# Patient Record
Sex: Male | Born: 1965 | Race: Black or African American | Hispanic: No | Marital: Single | Smoking: Current every day smoker
Health system: Southern US, Community
[De-identification: ages and names within clinical notes are randomized; demographics above are authoritative.]

## PROBLEM LIST (undated history)

## (undated) DIAGNOSIS — E119 Type 2 diabetes mellitus without complications: Secondary | ICD-10-CM

## (undated) DIAGNOSIS — I10 Essential (primary) hypertension: Secondary | ICD-10-CM

## (undated) DIAGNOSIS — M549 Dorsalgia, unspecified: Secondary | ICD-10-CM

## (undated) HISTORY — DX: Type 2 diabetes mellitus without complications: E11.9

---

## 2004-01-11 ENCOUNTER — Emergency Department: Payer: Self-pay | Admitting: Emergency Medicine

## 2005-08-25 ENCOUNTER — Emergency Department: Payer: Self-pay | Admitting: Emergency Medicine

## 2005-09-09 ENCOUNTER — Emergency Department: Payer: Self-pay | Admitting: Emergency Medicine

## 2005-09-13 ENCOUNTER — Emergency Department: Payer: Self-pay | Admitting: Emergency Medicine

## 2005-09-27 ENCOUNTER — Emergency Department: Payer: Self-pay | Admitting: Emergency Medicine

## 2006-07-07 ENCOUNTER — Emergency Department: Payer: Self-pay | Admitting: Unknown Physician Specialty

## 2007-10-30 ENCOUNTER — Emergency Department: Payer: Self-pay | Admitting: Emergency Medicine

## 2007-11-02 ENCOUNTER — Emergency Department: Payer: Self-pay | Admitting: Internal Medicine

## 2007-12-03 ENCOUNTER — Emergency Department: Payer: Self-pay | Admitting: Emergency Medicine

## 2008-01-12 ENCOUNTER — Emergency Department: Payer: Self-pay | Admitting: Emergency Medicine

## 2008-01-21 ENCOUNTER — Emergency Department: Payer: Self-pay | Admitting: Internal Medicine

## 2008-02-06 ENCOUNTER — Emergency Department: Payer: Self-pay | Admitting: Unknown Physician Specialty

## 2010-08-04 ENCOUNTER — Emergency Department: Payer: Self-pay | Admitting: Emergency Medicine

## 2010-08-08 ENCOUNTER — Emergency Department: Payer: Self-pay | Admitting: Internal Medicine

## 2010-08-20 ENCOUNTER — Emergency Department: Payer: Self-pay | Admitting: Internal Medicine

## 2010-09-30 ENCOUNTER — Emergency Department: Payer: Self-pay | Admitting: Emergency Medicine

## 2011-04-06 ENCOUNTER — Emergency Department: Payer: Self-pay

## 2011-06-29 ENCOUNTER — Observation Stay: Payer: Self-pay | Admitting: Internal Medicine

## 2011-06-29 LAB — BASIC METABOLIC PANEL
BUN: 16 mg/dL (ref 7–18)
Chloride: 108 mmol/L — ABNORMAL HIGH (ref 98–107)
EGFR (African American): >60
EGFR (Non-African Amer.): >60
Glucose: 111 mg/dL — ABNORMAL HIGH (ref 65–99)
Potassium: 4.1 mmol/L (ref 3.5–5.1)

## 2011-06-29 LAB — CBC
HGB: 13.8 g/dL (ref 13.0–18.0)
MCH: 29 pg (ref 26.0–34.0)
MCHC: 32.4 g/dL (ref 32.0–36.0)
WBC: 7.7 10*3/uL (ref 3.8–10.6)

## 2011-06-29 LAB — CK TOTAL AND CKMB (NOT AT ARMC)
CK, Total: 290 U/L — ABNORMAL HIGH (ref 35–232)
CK-MB: 2.5 ng/mL (ref 0.5–3.6)
CK-MB: 2.2 ng/mL (ref 0.5–3.6)

## 2011-06-29 LAB — TROPONIN I: Troponin-I: <0.02 ng/mL

## 2011-06-30 LAB — LIPID PANEL
Cholesterol: 137 mg/dL (ref 0–200)
HDL Cholesterol: 25 mg/dL — ABNORMAL LOW (ref 40–60)
Triglycerides: 177 mg/dL (ref 0–200)
VLDL Cholesterol, Calc: 35 mg/dL (ref 5–40)

## 2011-06-30 LAB — BASIC METABOLIC PANEL
BUN: 15 mg/dL (ref 7–18)
Calcium, Total: 8.4 mg/dL — ABNORMAL LOW (ref 8.5–10.1)
Chloride: 107 mmol/L (ref 98–107)
Co2: 26 mmol/L (ref 21–32)
Creatinine: 0.99 mg/dL (ref 0.60–1.30)
Osmolality: 283 (ref 275–301)
Potassium: 3.8 mmol/L (ref 3.5–5.1)
Sodium: 141 mmol/L (ref 136–145)

## 2011-06-30 LAB — CBC WITH DIFFERENTIAL/PLATELET
Basophil #: 0 10*3/uL (ref 0.0–0.1)
Basophil %: 0.5 %
Eosinophil #: 0.2 10*3/uL (ref 0.0–0.7)
HCT: 43.1 % (ref 40.0–52.0)
HGB: 13.9 g/dL (ref 13.0–18.0)
MCH: 29 pg (ref 26.0–34.0)
Monocyte #: 0.6 x10 3/mm (ref 0.2–1.0)
Neutrophil #: 4.4 10*3/uL (ref 1.4–6.5)
Neutrophil %: 51.1 %
RBC: 4.77 10*6/uL (ref 4.40–5.90)
RDW: 14.7 % — ABNORMAL HIGH (ref 11.5–14.5)
WBC: 8.6 10*3/uL (ref 3.8–10.6)

## 2011-06-30 LAB — DRUG SCREEN, URINE
Barbiturates, Ur Screen: NEGATIVE (ref ?–200)
Benzodiazepine, Ur Scrn: NEGATIVE (ref ?–200)
Cocaine Metabolite,Ur ~~LOC~~: NEGATIVE (ref ?–300)
MDMA (Ecstasy)Ur Screen: NEGATIVE (ref ?–500)
Methadone, Ur Screen: NEGATIVE (ref ?–300)
Opiate, Ur Screen: NEGATIVE (ref ?–300)
Phencyclidine (PCP) Ur S: NEGATIVE (ref ?–25)
Tricyclic, Ur Screen: NEGATIVE (ref ?–1000)

## 2011-06-30 LAB — HEMOGLOBIN A1C: Hemoglobin A1C: 7.7 % — ABNORMAL HIGH (ref 4.2–6.3)

## 2012-05-01 ENCOUNTER — Emergency Department: Payer: Self-pay | Admitting: Unknown Physician Specialty

## 2012-05-01 LAB — CBC
MCHC: 33.2 g/dL (ref 32.0–36.0)
RBC: 4.87 10*6/uL (ref 4.40–5.90)
WBC: 9.6 10*3/uL (ref 3.8–10.6)

## 2012-05-01 LAB — URINALYSIS, COMPLETE
Bilirubin,UR: NEGATIVE
Glucose,UR: >=500 mg/dL (ref 0–75)
Nitrite: POSITIVE
Ph: 6 (ref 4.5–8.0)
Protein: NEGATIVE
RBC,UR: 4 /HPF (ref 0–5)
WBC UR: 2 /HPF (ref 0–5)

## 2012-05-01 LAB — BASIC METABOLIC PANEL
Chloride: 110 mmol/L — ABNORMAL HIGH (ref 98–107)
Creatinine: 0.95 mg/dL (ref 0.60–1.30)
EGFR (African American): >60
EGFR (Non-African Amer.): >60

## 2012-05-01 LAB — HEPATIC FUNCTION PANEL A (ARMC)
Alkaline Phosphatase: 99 U/L (ref 50–136)
Bilirubin, Direct: <0.1 mg/dL (ref 0.00–0.20)
Bilirubin,Total: 0.2 mg/dL (ref 0.2–1.0)
SGPT (ALT): 26 U/L (ref 12–78)
Total Protein: 7 g/dL (ref 6.4–8.2)

## 2012-05-01 LAB — TROPONIN I: Troponin-I: <0.02 ng/mL

## 2012-05-01 LAB — CK TOTAL AND CKMB (NOT AT ARMC)
CK, Total: 533 U/L — ABNORMAL HIGH (ref 35–232)
CK-MB: 4.8 ng/mL — ABNORMAL HIGH (ref 0.5–3.6)

## 2012-06-14 ENCOUNTER — Emergency Department: Payer: Self-pay | Admitting: Unknown Physician Specialty

## 2012-06-14 LAB — COMPREHENSIVE METABOLIC PANEL
Albumin: 3.6 g/dL (ref 3.4–5.0)
Anion Gap: 6 — ABNORMAL LOW (ref 7–16)
Calcium, Total: 8.5 mg/dL (ref 8.5–10.1)
Chloride: 105 mmol/L (ref 98–107)
Co2: 27 mmol/L (ref 21–32)
Glucose: 117 mg/dL — ABNORMAL HIGH (ref 65–99)
Osmolality: 279 (ref 275–301)
Potassium: 3.9 mmol/L (ref 3.5–5.1)
Total Protein: 7.5 g/dL (ref 6.4–8.2)

## 2012-06-14 LAB — CBC
HCT: 43.1 % (ref 40.0–52.0)
MCH: 29.4 pg (ref 26.0–34.0)
MCV: 89 fL (ref 80–100)

## 2012-06-14 LAB — TROPONIN I: Troponin-I: <0.02 ng/mL

## 2013-03-15 ENCOUNTER — Emergency Department: Payer: Self-pay | Admitting: Emergency Medicine

## 2013-06-08 ENCOUNTER — Emergency Department: Payer: Self-pay | Admitting: Emergency Medicine

## 2013-06-18 ENCOUNTER — Emergency Department: Payer: Self-pay | Admitting: Emergency Medicine

## 2013-10-07 ENCOUNTER — Emergency Department: Payer: Self-pay | Admitting: Emergency Medicine

## 2013-10-07 LAB — CBC
HCT: 44.8 % (ref 40.0–52.0)
HGB: 14.7 g/dL (ref 13.0–18.0)
MCH: 29.5 pg (ref 26.0–34.0)
MCHC: 32.8 g/dL (ref 32.0–36.0)
MCV: 90 fL (ref 80–100)
Platelet: 216 10*3/uL (ref 150–440)
RBC: 4.99 10*6/uL (ref 4.40–5.90)
RDW: 14.8 % — ABNORMAL HIGH (ref 11.5–14.5)
WBC: 9.2 10*3/uL (ref 3.8–10.6)

## 2013-10-07 LAB — BASIC METABOLIC PANEL
Anion Gap: 8 (ref 7–16)
BUN: 14 mg/dL (ref 7–18)
CHLORIDE: 105 mmol/L (ref 98–107)
CO2: 25 mmol/L (ref 21–32)
CREATININE: 1.06 mg/dL (ref 0.60–1.30)
Calcium, Total: 8.7 mg/dL (ref 8.5–10.1)
EGFR (African American): >60
EGFR (Non-African Amer.): >60
Glucose: 185 mg/dL — ABNORMAL HIGH (ref 65–99)
OSMOLALITY: 281 (ref 275–301)
POTASSIUM: 3.7 mmol/L (ref 3.5–5.1)
Sodium: 138 mmol/L (ref 136–145)

## 2013-10-11 ENCOUNTER — Emergency Department: Payer: Self-pay | Admitting: Emergency Medicine

## 2014-06-29 NOTE — H&P (Signed)
PATIENT NAME:  William Duke, William Duke MR#:  [id number] DATE OF BIRTH:  [birth year]  DATE OF ADMISSION:  06/29/2011  ER REFERRING PHYSICIAN: Maurilio LovelyNoelle McLaurin, MD    PRIMARY CARE PHYSICIAN: None. The patient reports that he occasionally goes to [facility] to get a refill of his medications but does not follow up with a doctor regularly.   CHIEF COMPLAINT: Chest pain.   HISTORY OF PRESENT ILLNESS: The patient is a 49 year old male with past medical history of hypertension, diabetes for the past one year. The patient does not remember what medications he is taking. He has no history of coronary artery disease or heart disease. He has never had any cardiac history or work-up in the past. He denies any drug abuse other than smoking. 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 The patient reports not having similar symptoms in the past, denies any radiation of the pain, denies any associated nausea, vomiting, diaphoresis. Currently he is chest pain free. He says he is normally very active and exercises 3 to 4 times a week, including walking and pushups. He has no family history of premature coronary artery disease.   PAST MEDICAL HISTORY: 1. Hypertension.  2. Diabetes.   PAST SURGICAL HISTORY: None.   MEDICATIONS: The patient does not know what medications he is taking. He says he goes to [facility] on [address].   SOCIAL HISTORY: He smokes 1/2 pack per day for more than 20 years. He denies any alcohol use, occasionally drinks a beer maybe once a month. He denies any drug abuse. He works as a Sales executivesandblaster.   FAMILY HISTORY: Mother had diabetes. Father had diabetes and CVA.    REVIEW OF SYSTEMS: CONSTITUTIONAL: Denies any fever, fatigue, weakness. EYES: Denies any blurred or double vision. ENT: Denies any tinnitus, ear pain. RESPIRATORY: Denies any cough, wheezing. CARDIOVASCULAR: Denies any  chest pain or orthopnea. GI: Denies any nausea, vomiting, diarrhea, or abdominal pain. GU: Denies any dysuria or hematuria. ENDOCRINE: Denies any polyuria or nocturia. HEME/LYMPH: Denies any anemia or easy bruisability. INTEGUMENTARYH: Denies any acne or rash. MUSCULOSKELETAL: Denies any swelling, gout. NEUROLOGICAL: Denies any numbness or weakness. PSYCHIATRIC: Denies any anxiety or depression.   PHYSICAL EXAMINATION:  VITAL SIGNS: Temperature 98.1, heart rate 77, respiratory rate 20, blood pressure 160/74, pulse oximetry 98% on room air.   GENERAL: The patient is a 49 year old African American male sitting comfortably in bed, not in acute distress.   HEENT: Head: Atraumatic, normocephalic. Eyes: There is no pallor, icterus or cyanosis. Pupils equal, round, and reactive to light and accommodation. Extraocular movements intact. ENT: Wet mucous membranes. No oropharyngeal erythema or thrush.   NECK: Supple. No masses. No JVD. No thyromegaly or lymphadenopathy.   CHEST WALL: No tenderness to palpation. Not using accessory muscles of respiration. No intercostal muscle retractions.   LUNGS: Bilaterally clear to auscultation. No wheezing, rales or rhonchi.   CARDIOVASCULAR: S1, S2 regular. No murmur, rubs, or gallops.   ABDOMEN: Soft, nontender, nondistended. No guarding or rigidity. No organomegaly. Normal bowel sounds.   SKIN: No rashes or lesions.   PERIPHERIES: No pedal edema, 2+ pedal pulses.   MUSCULOSKELETAL: No cyanosis or clubbing.   NEUROLOGICAL: Awake, alert, oriented x3. Nonfocal neurological exam. Cranial nerves are grossly intact.   PSYCHIATRIC: Normal mood and affect.   LABORATORY, DIAGNOSTIC AND RADIOLOGICAL DATA:  CK 323, normal troponin.  Chest X-ray shows no acute cardiopulmonary  pathology.  CBC normal. Glucose 111, chloride 108. The rest of the comprehensive metabolic panel is normal.   ASSESSMENT AND PLAN: A 49 year old male with past medical history of diabetes and  hypertension, presents with chest pain.   1. Chest pain: Presentation is atypical; however, given the patient's risk factors of hypertension, diabetes, and smoking we will admit him to the hospital, place him on telemetry, check serial cardiac enzymes, start him on aspirin, beta blocker, ACE inhibitor, statin, and p.r.n. nitroglycerin. We will obtain an inpatient stress test.  2. Diabetes: The patient does not know what medication he is taking. His serum glucose is 111, appears to be well controlled. We will place on an ADA diet, insulin sliding scale, and hemoglobin A1c.  3. Hypertension: Blood pressure is slightly elevated at present. We will start him on a beta blocker and ACE inhibitor.  4. Smoking: The patient has been counseled for more than three minutes. We will place on a nicotine patch.   I reviewed all medical  records, discussed with the patient the plan of care and management, discussed with the ED physician.   TIME SPENT: 75 minutes.  ____________________________ Darrick Meigs, MD sp:cbb D: 06/29/2011 16:04:56 ET T: 06/29/2011 16:36:51 ET JOB#: [id number]  cc: Darrick Meigs, MD, <Dictator> Darrick Meigs MD ELECTRONICALLY SIGNED 06/30/2011 13:17

## 2014-06-29 NOTE — Discharge Summary (Signed)
PATIENT NAME:  William Duke, William Duke MR#:  [id number] DATE OF BIRTH:  1965/11/17  DATE OF ADMISSION:  06/29/2011 DATE OF DISCHARGE:  06/30/2011  PRIMARY CARE PHYSICIAN: [facility]   REASON FOR ADMISSION: Chest pain.   DISCHARGE DIAGNOSES: 1. Chest pain, atypical suspected to be of noncardiac etiology, possibly related to gastroesophageal reflux disease with negative cardiac enzymes and negative stress test.  2. History of hypertension.  3. History of type 2 diabetes mellitus which is failing diet control, hemoglobin A1c 7.7. 4. History of tobacco abuse.   CONSULTATION: None.   DISCHARGE DISPOSITION: Home.   DISCHARGE MEDICATIONS:  1. Aspirin 81 mg daily.  2. Metoprolol 25 mg p.o. b.i.d.  3. Lisinopril 5 mg p.o. daily.  4. Metformin 500 mg p.o. b.i.d.  5. Prilosec 20 mg p.o. daily.  6. Tylenol 325 mg 1 to 2 tablets p.o. every 4 to 6 hours p.r.n. pain.   DISCHARGE CONDITION: Improved, stable.  DISCHARGE ACTIVITY: As tolerated.   DISCHARGE DIET: Low sodium, ADA, low fat, low cholesterol.   DISCHARGE INSTRUCTIONS:  1. Take medications as prescribed.  2. Return to the Emergency Department for recurrence of symptoms or for development of shortness of breath, chest pain, arm, shoulder or jaw pain.    FOLLOWUP INSTRUCTIONS: Follow up with your primary care physician at [facility] 1 to 2 weeks.   LABORATORY, DIAGNOSTIC AND RADIOLOGICAL DATA:  Chest x-ray PA and lateral 06/29/2011: No acute cardiopulmonary abnormalities are noted.   LexiScan/Myoview 06/30/2011: Normal study with normal LV systolic function. No evidence of ischemia. No significant EKG changes. Ejection fraction 58%.   Hemoglobin A1c 7.7.   Lipid panel: Total cholesterol 137, triglycerides 177, HDL 25, LDL 77.   Troponins were negative x3 sets.   Urine drug screen negative.   CBC normal on admission.   EKG on admission: Normal sinus rhythm, heart rate 72 beats per minute with normal axis,  normal intervals, nonspecific T wave abnormality.   BRIEF HISTORY/HOSPITAL COURSE: Patient is a 49 year old male with past medical history of hypertension, type 2 diabetes mellitus and tobacco abuse who presented to the Emergency Department with complaints of chest pain. Please see dictated admission history and physical for pertinent details surrounding the onset of this hospitalization and please see below for further details. 1. Chest pain which is atypical with nonspecific T wave abnormality on admission with negative troponin. Thereafter patient was admitted to the hospital for further evaluation and management. He was maintained on aspirin therapy as well as p.r.n. pain control. Patient's chest pain has spontaneously resolved. He has ruled out for myocardial infarction based off three negative sets of cardiac enzymes. Thereafter he was sent for Myoview which was a negative study. He does have some risk factors for coronary artery disease including hypertension, diabetes mellitus and tobacco abuse for which he has been started on aspirin for primary prevention. Chest pain could be gastroesophageal reflux disease related for which he will be empirically started on a trial of PPI therapy and will need close outpatient follow up. He is chest pain free at the time of discharge at rest and also with ambulation. In regards to his hypertension, blood pressure is well controlled and he is to continue beta blocker and ACE inhibitor therapy. In regards to his type 2 diabetes mellitus, this is failing diet control and hemoglobin A1c was 7.7 and he is being started on metformin for diabetic control at the time of discharge and will need close outpatient follow-up.  2. In  regards to his tobacco abuse, he was strongly counseled on the importance of smoking cessation.  3. On 06/30/2011 patient was hemodynamically stable and without any chest pain and was felt to be stable for discharge home with close outpatient follow  up.  TIME SPENT ON DISCHARGE: Greater than 30 minutes.   ____________________________ Elon AlasKamran N. Storie Heffern, MD knl:cms D: 07/04/2011 13:18:39 ET T: 07/05/2011 09:46:05 ET JOB#: [id number]  cc: Elon AlasKamran N. Rayelynn Loyal, MD, <Dictator> [facility]  Elon AlasKAMRAN N Jariya Reichow MD ELECTRONICALLY SIGNED 07/11/2011 15:09

## 2015-04-13 IMAGING — CR DG THORACIC SPINE 2-3V
1 series · 4 of 4 positions shown · non-contrast
Comparison: None.

CLINICAL DATA: Pain post trauma

EXAM:
THORACIC SPINE -3 VIEW

[Series 1: t thoracic spine ap · 0.14mm/px · 4 of 4 slices shown]
[im 1/4]
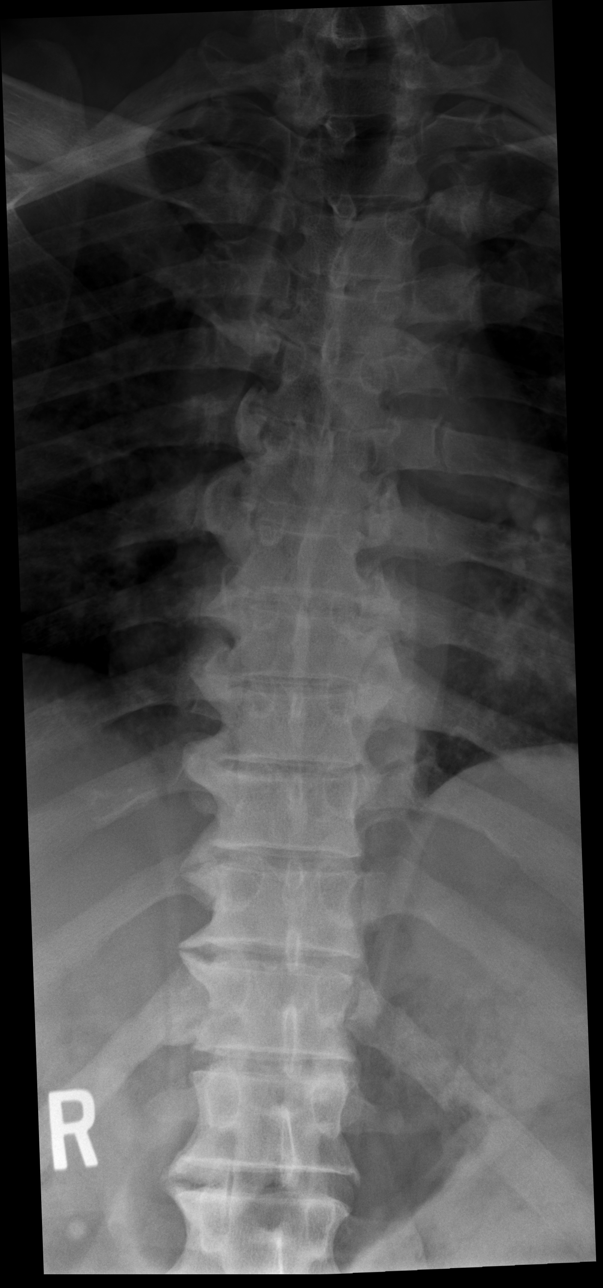
[im 2/4]
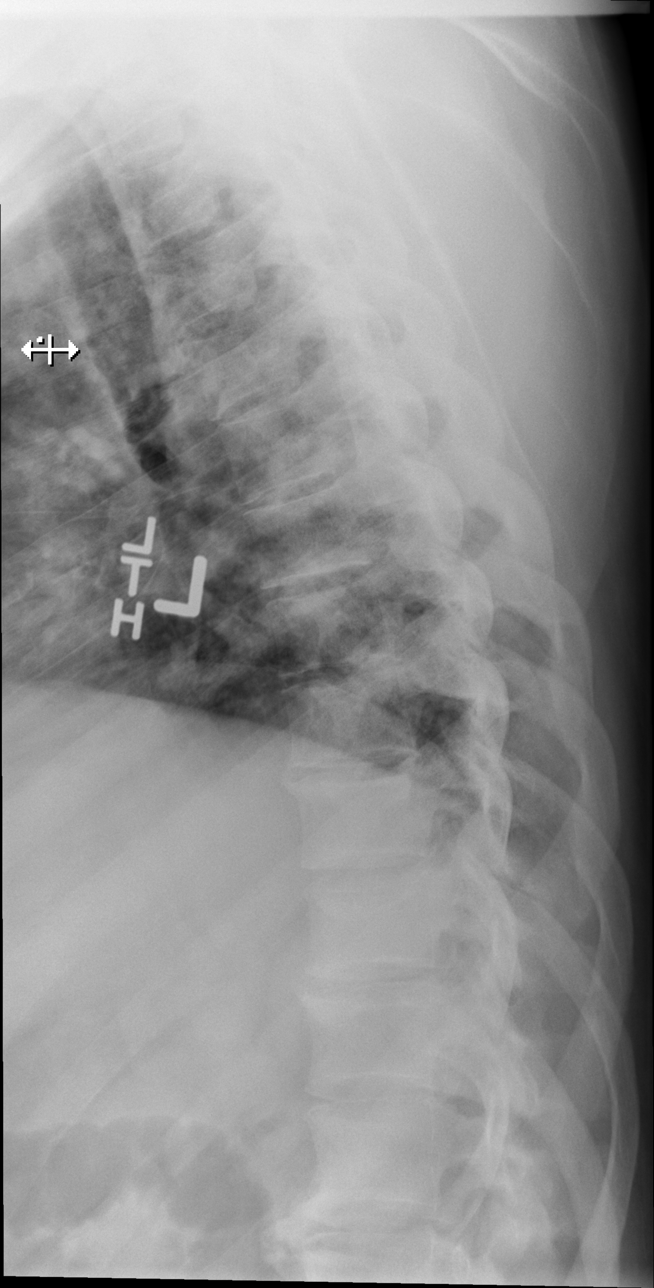
[im 3/4]
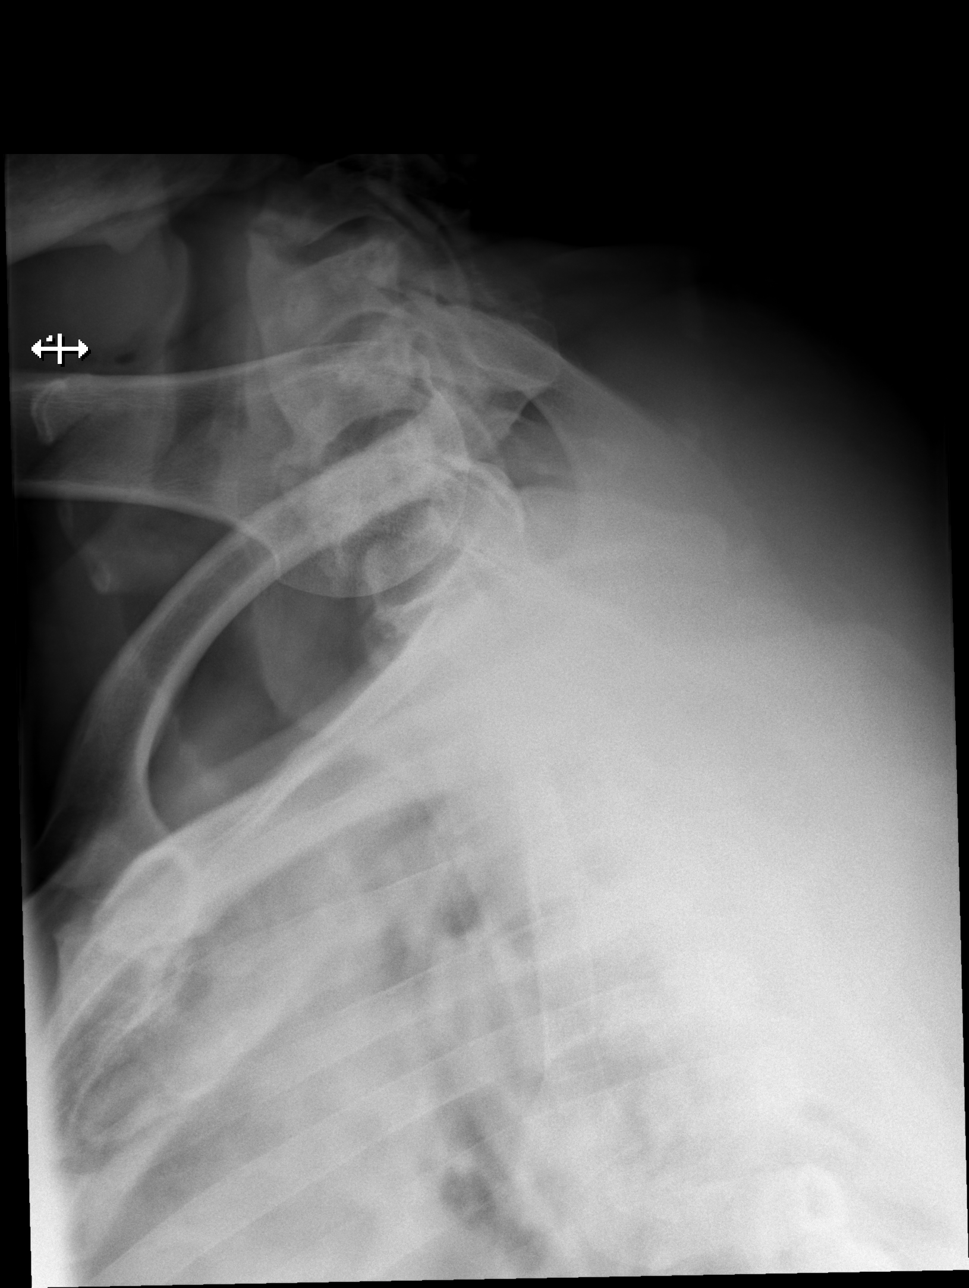
[im 4/4]
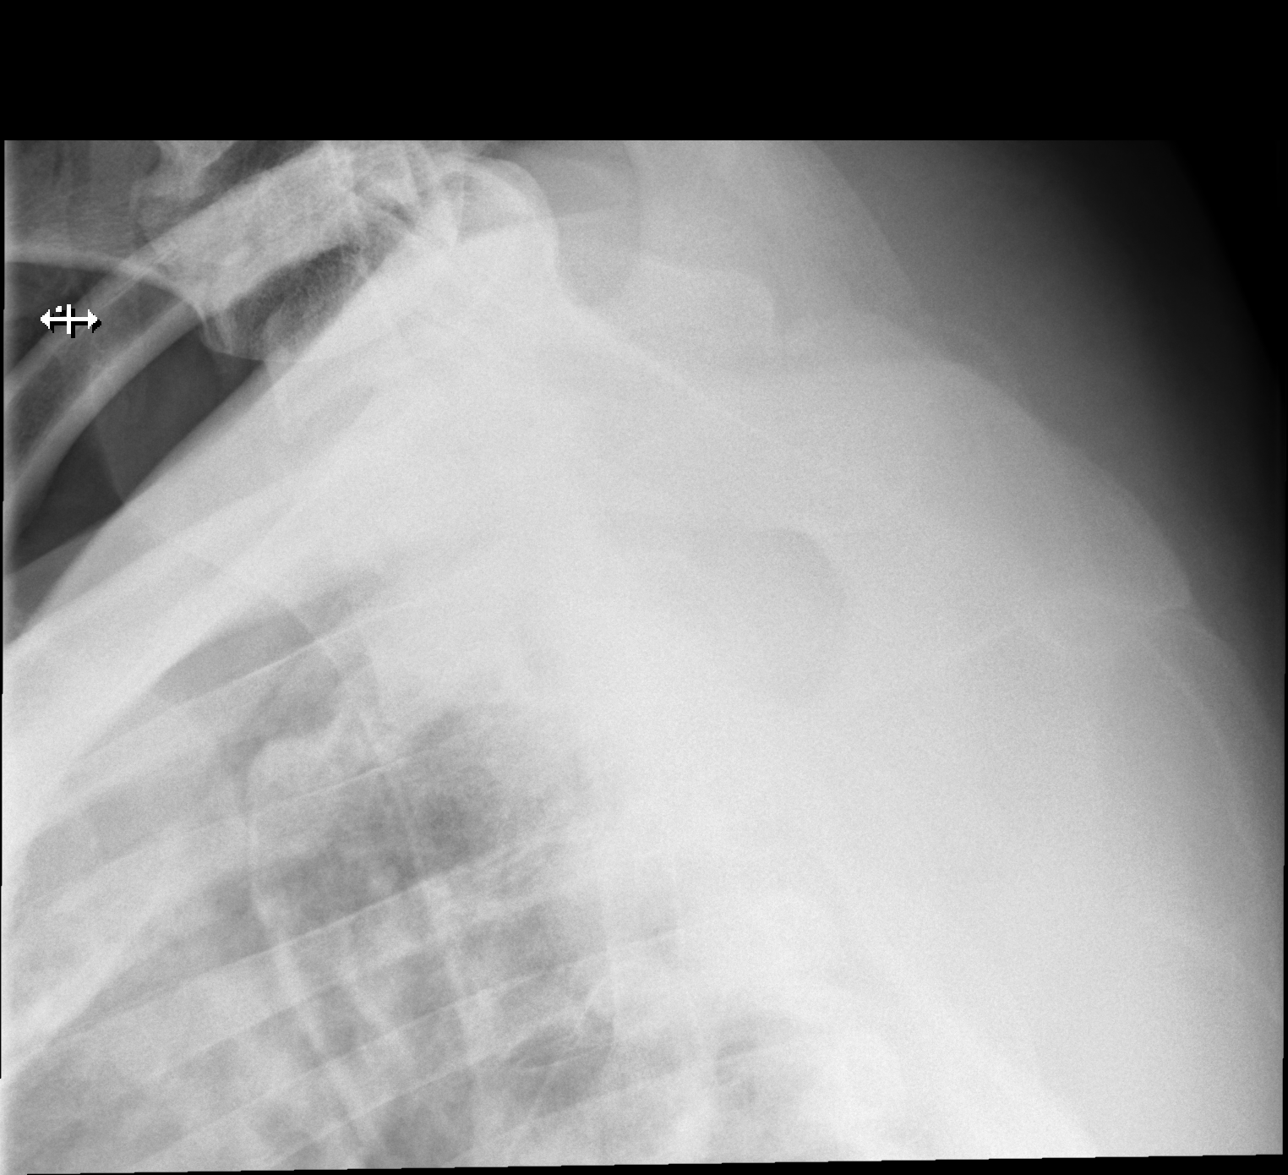

[4 of 4 positions shown; findings below may reference images not displayed]

FINDINGS: Frontal, lateral, and swimmer's views were obtained. There is mild
upper thoracic levoscoliosis. There is no demonstrable fracture or
spondylolisthesis. There is mild disc space narrowing at several
levels as well as several right-sided osteophytes consistent with
osteoarthritic change. No erosive change.
IMPRESSION: Osteoarthritic changes several levels. Mild scoliosis. No fracture
or spondylolisthesis.

## 2015-04-13 IMAGING — CR PELVIS - 1-2 VIEW
1 series · 1 of 1 positions shown · non-contrast
Comparison: 10/07/2013

CLINICAL DATA: Injury, fall, pain

EXAM:
PELVIS - 1-2 VIEW

[t pelvis ap]
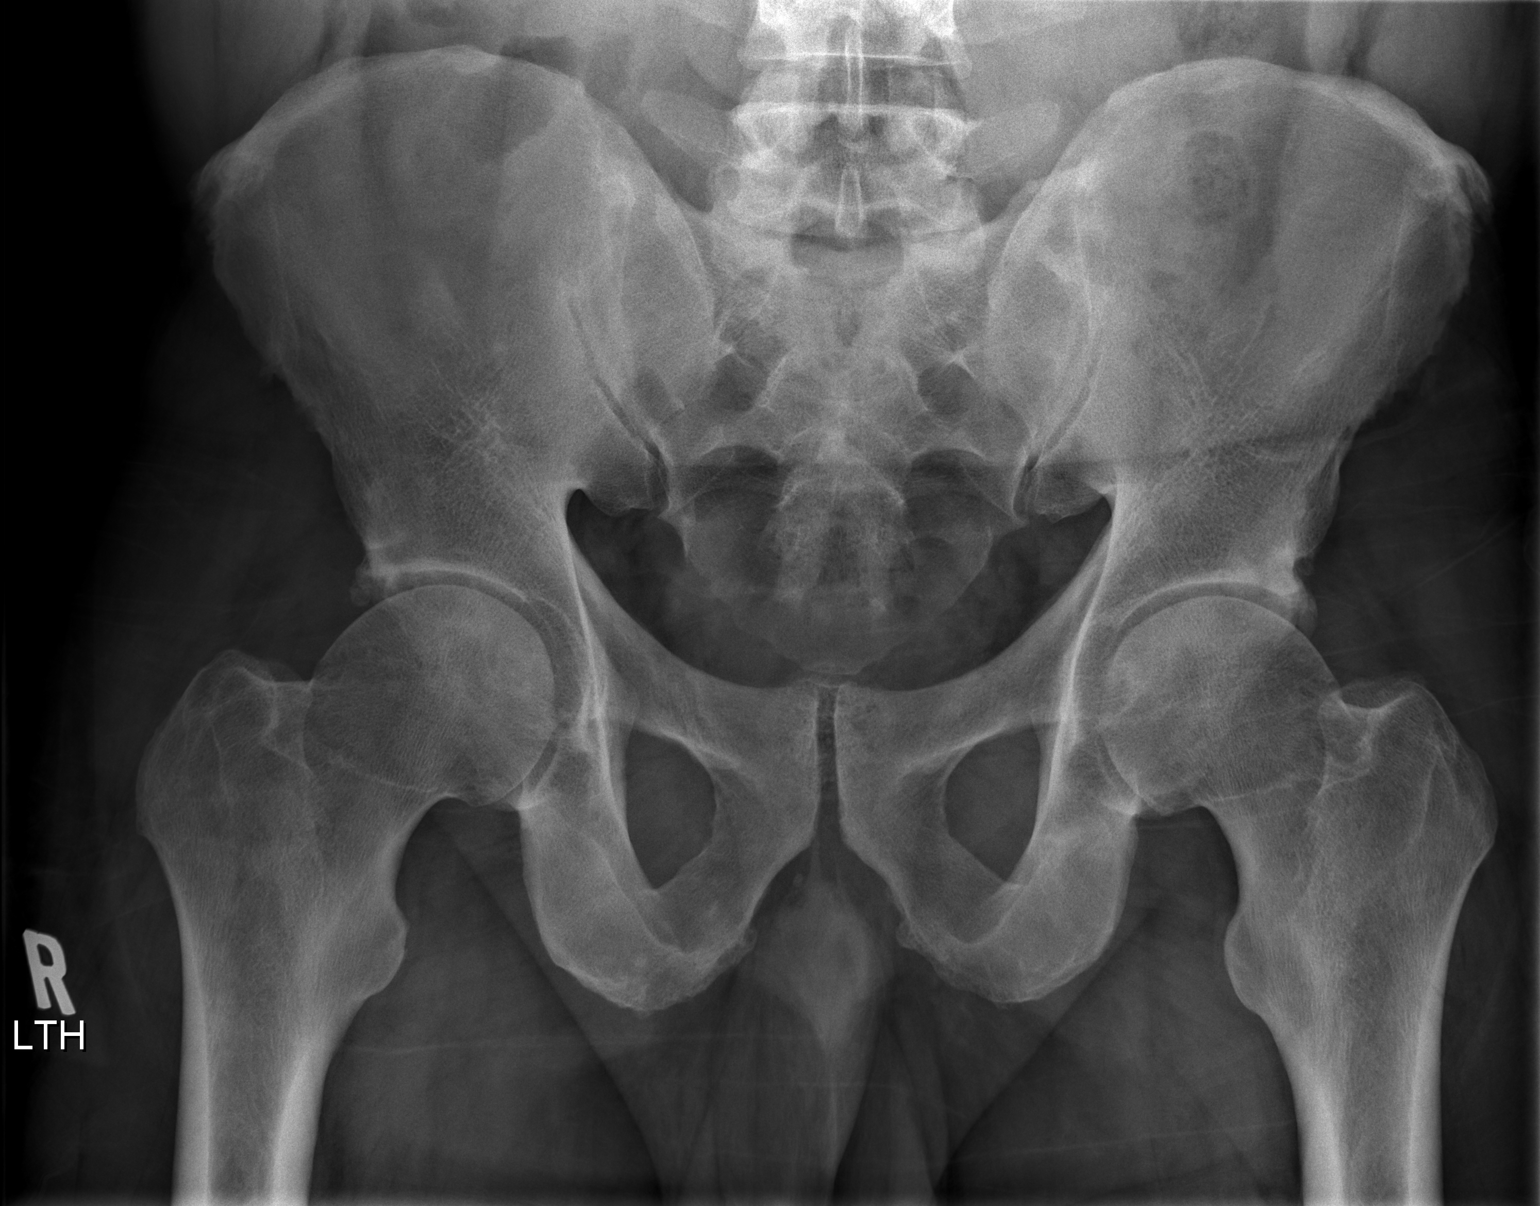

[1 of 1 positions shown; findings below may reference images not displayed]

FINDINGS: Mild hip degenerative changes. Normal alignment. No displaced
fracture. Pelvis intact. No diastases. Normal appearing SI joints.
IMPRESSION: No acute osseous finding

## 2015-04-13 IMAGING — CR DG LUMBAR SPINE 2-3V
1 series · 3 of 3 positions shown · non-contrast
Comparison: None.

CLINICAL DATA: Pain post trauma

EXAM:
LUMBAR SPINE - 2-3 VIEW

[Series 1: t lumbar spine ap · 0.14mm/px · 3 of 3 slices shown]
[im 1/3]
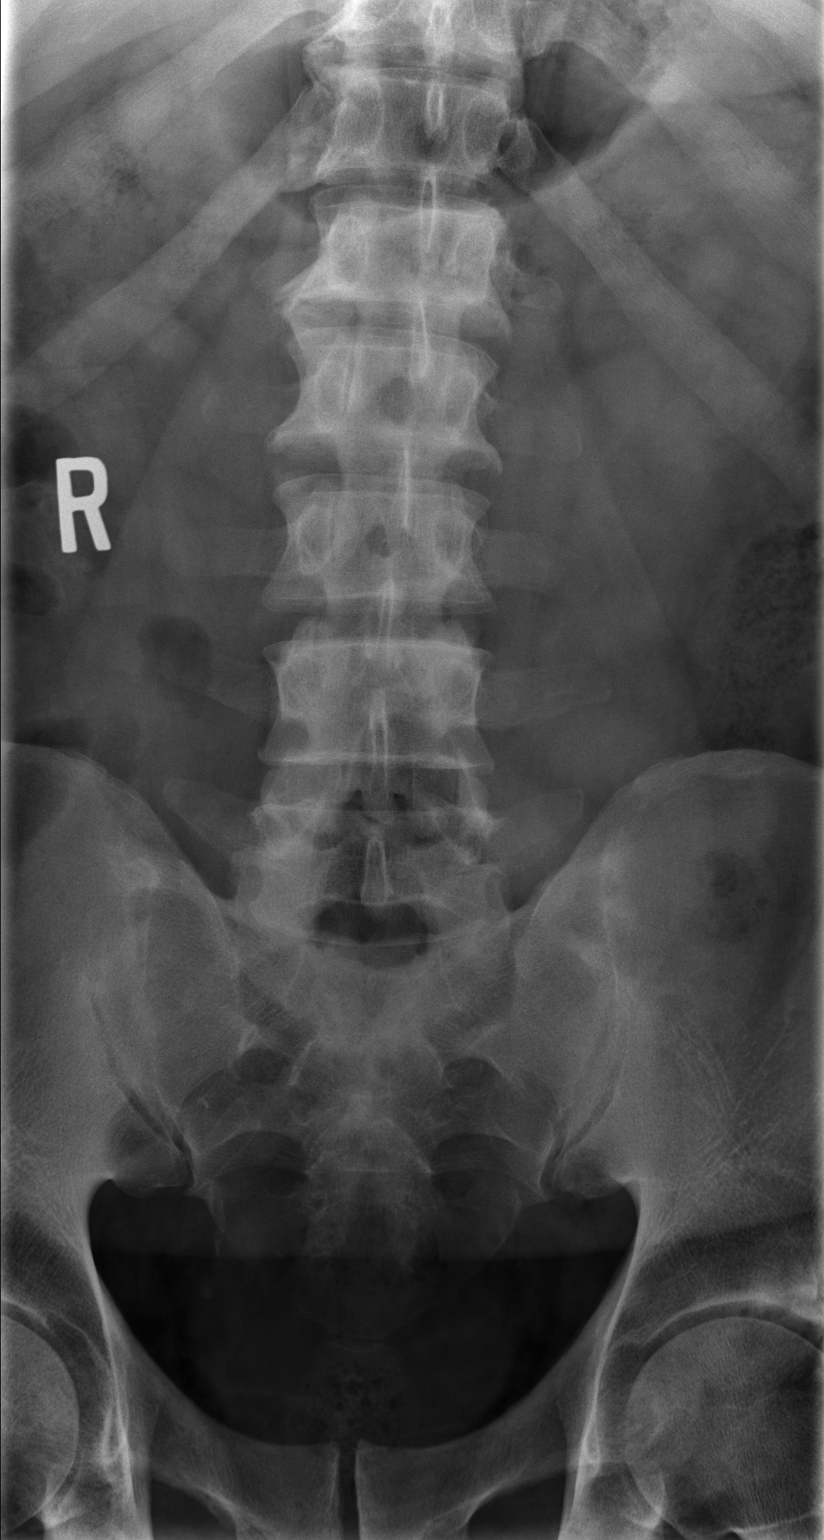
[im 2/3]
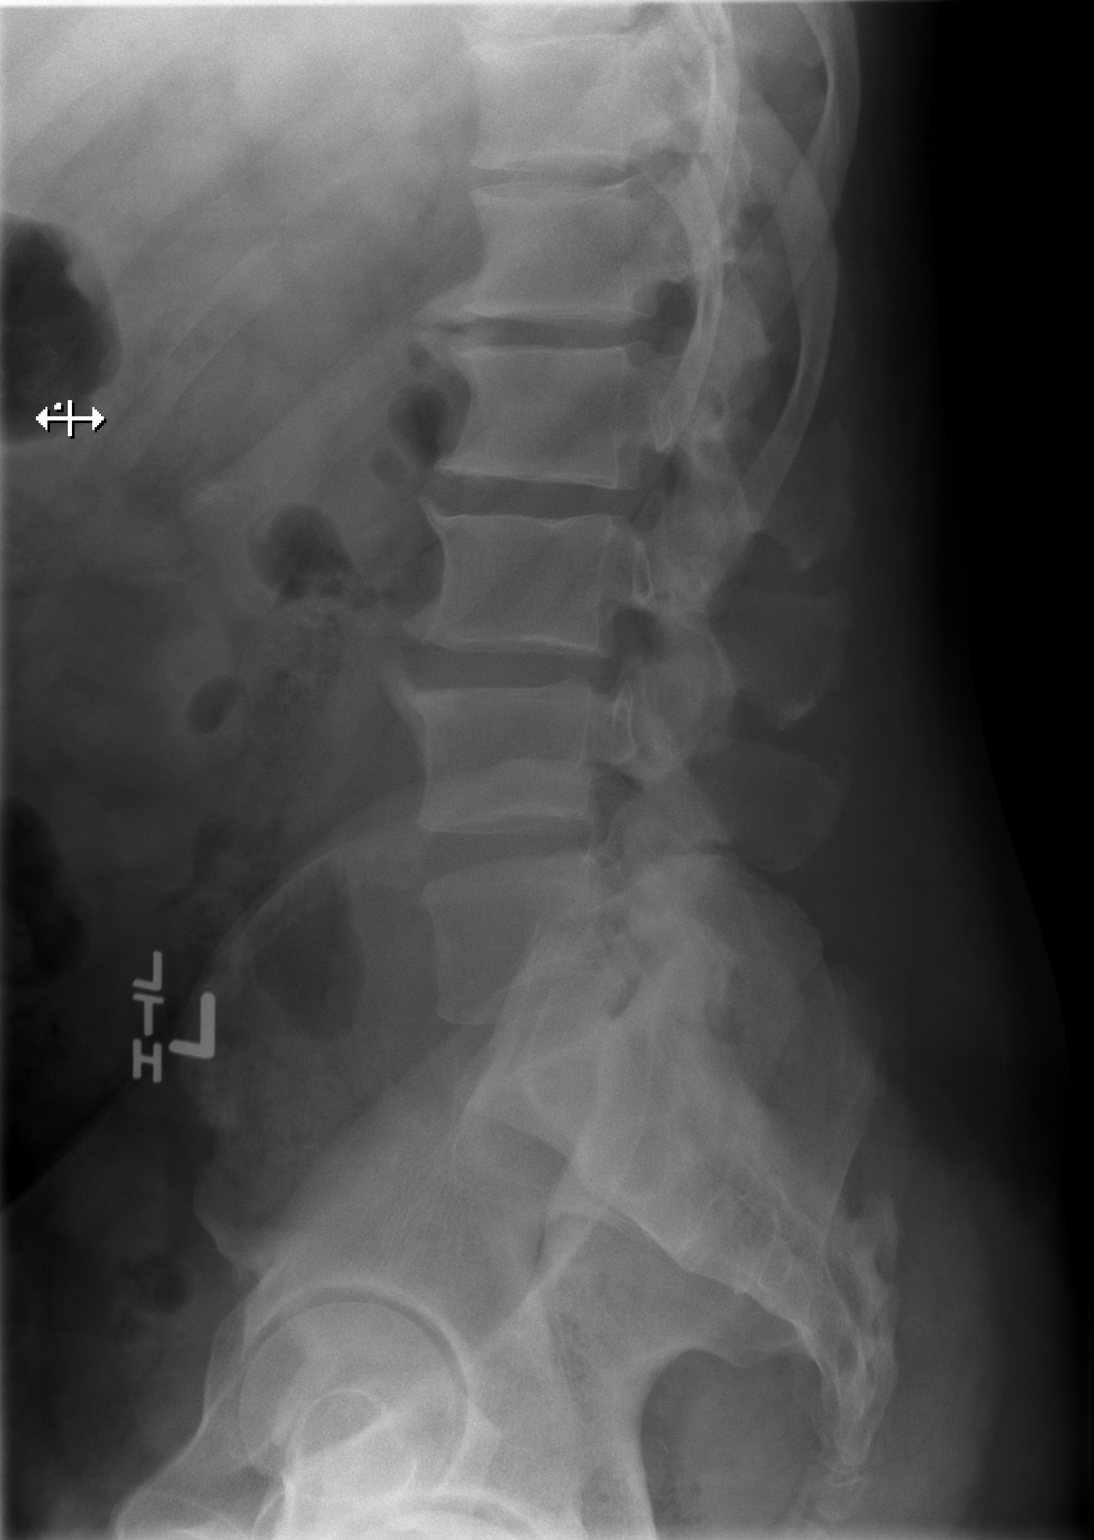
[im 3/3]
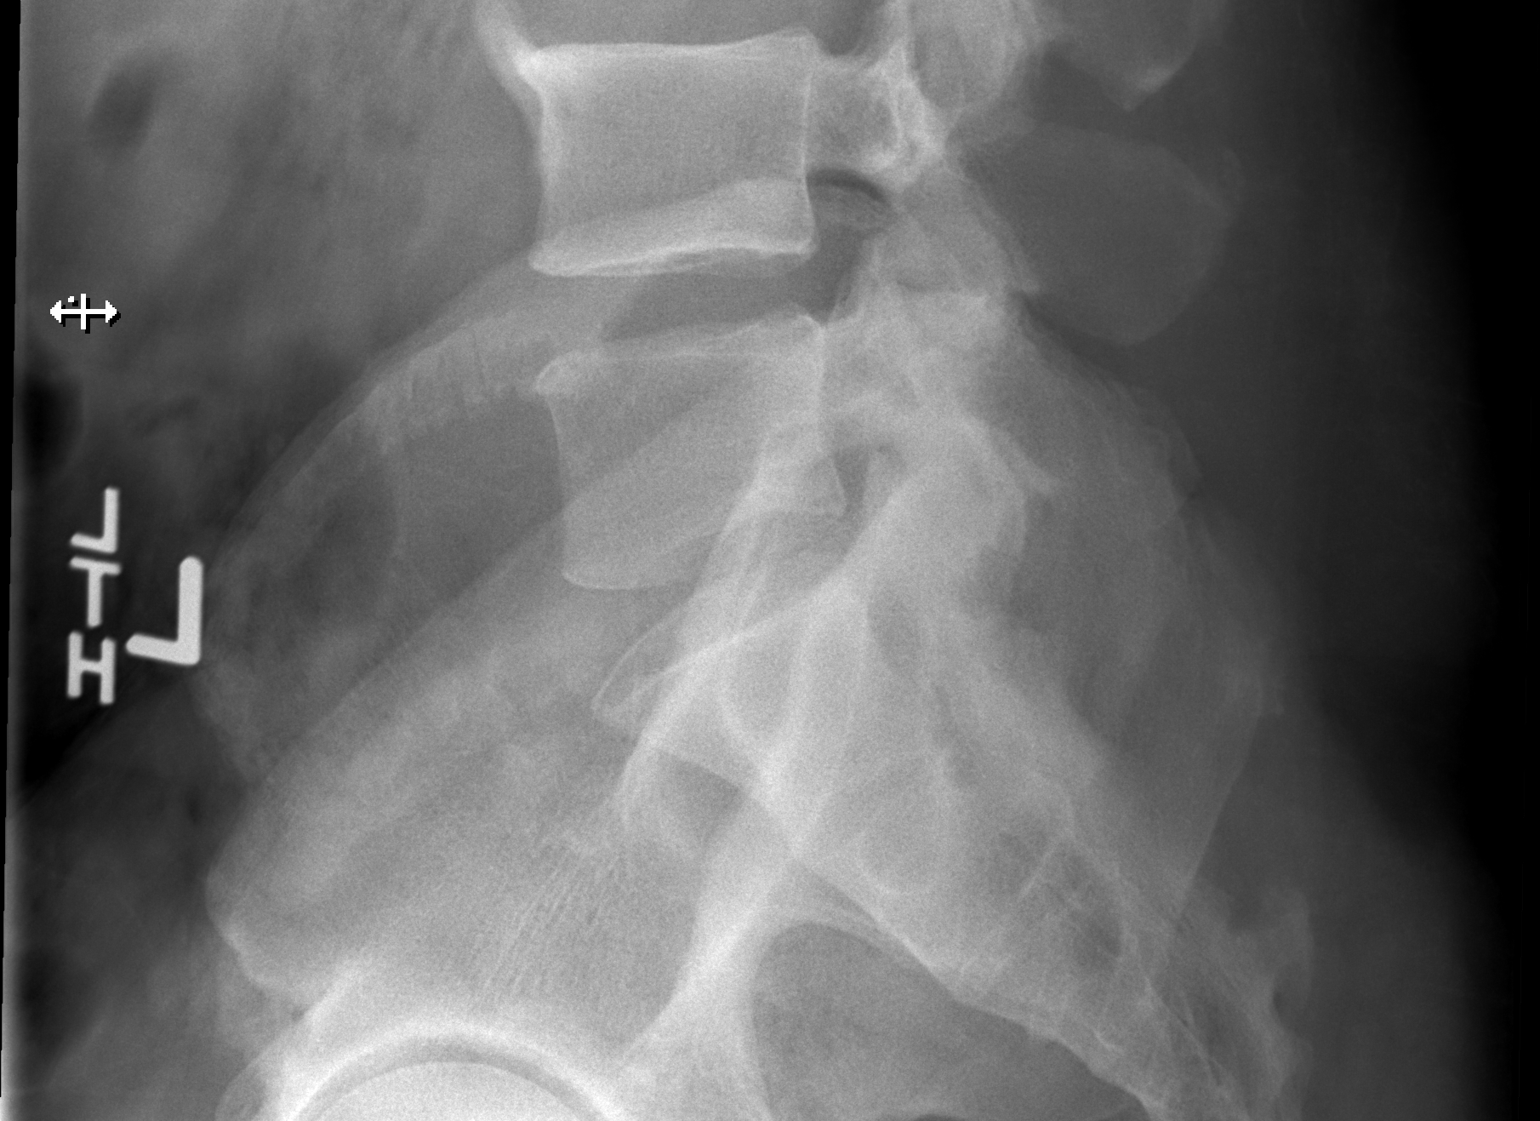

[3 of 3 positions shown; findings below may reference images not displayed]

FINDINGS: Frontal, lateral, and spot lumbosacral lateral images were obtained.
There are 5 non-rib-bearing lumbar type vertebral bodies. There is
no fracture or spondylolisthesis. There is mild disc space narrowing
at L1-2. There are anterior osteophytes at several levels. No
erosive change.
IMPRESSION: Areas of osteoarthritic change.  No fracture or spondylolisthesis.

## 2017-02-16 ENCOUNTER — Encounter: Payer: Self-pay | Admitting: Emergency Medicine

## 2017-02-16 ENCOUNTER — Emergency Department
Admission: EM | Admit: 2017-02-16 | Discharge: 2017-02-16 | Disposition: A | Discharge status: Home / Self Care | Payer: Self-pay | Attending: Emergency Medicine | Admitting: Emergency Medicine

## 2017-02-16 ENCOUNTER — Other Ambulatory Visit: Payer: Self-pay

## 2017-02-16 DIAGNOSIS — Y9389 Activity, other specified: Secondary | ICD-10-CM | POA: Insufficient documentation

## 2017-02-16 DIAGNOSIS — F17200 Nicotine dependence, unspecified, uncomplicated: Secondary | ICD-10-CM | POA: Insufficient documentation

## 2017-02-16 DIAGNOSIS — Y998 Other external cause status: Secondary | ICD-10-CM | POA: Insufficient documentation

## 2017-02-16 DIAGNOSIS — S39012A Strain of muscle, fascia and tendon of lower back, initial encounter: Secondary | ICD-10-CM | POA: Insufficient documentation

## 2017-02-16 DIAGNOSIS — Y929 Unspecified place or not applicable: Secondary | ICD-10-CM | POA: Insufficient documentation

## 2017-02-16 DIAGNOSIS — X500XXA Overexertion from strenuous movement or load, initial encounter: Secondary | ICD-10-CM | POA: Insufficient documentation

## 2017-02-16 DIAGNOSIS — I10 Essential (primary) hypertension: Secondary | ICD-10-CM | POA: Insufficient documentation

## 2017-02-16 HISTORY — DX: Dorsalgia, unspecified: M54.9

## 2017-02-16 HISTORY — DX: Essential (primary) hypertension: I10

## 2017-02-16 MED ORDER — MELOXICAM 15 MG PO TABS: 15.0000 mg | ORAL_TABLET | Freq: Every day | ORAL | 2 refills | Status: AC

## 2017-02-16 MED ORDER — CYCLOBENZAPRINE HCL 10 MG PO TABS: 10.0000 mg | ORAL_TABLET | Freq: Three times a day (TID) | ORAL | 0 refills | Status: DC | PRN

## 2017-02-16 MED ORDER — KETOROLAC TROMETHAMINE 30 MG/ML IJ SOLN
30.0000 mg | Freq: Once | INTRAMUSCULAR | Status: AC
  Administered 2017-02-16: 30 mg via INTRAMUSCULAR
  Filled 2017-02-16: qty 1

## 2017-02-16 MED ORDER — CYCLOBENZAPRINE HCL 10 MG PO TABS
5.0000 mg | ORAL_TABLET | Freq: Once | ORAL | Status: AC
  Administered 2017-02-16: 5 mg via ORAL
  Filled 2017-02-16: qty 1

## 2017-02-16 NOTE — ED Triage Notes (Signed)
Brought via ems with lower back pain states pain is moving into both legs   States pain started after shoveling snow

## 2017-02-16 NOTE — ED Provider Notes (Signed)
[facility] Emergency Department Provider Note  ____________________________________________   First MD Initiated Contact with Patient 02/16/17 1653     (approximate)  I have reviewed the triage vital signs and the nursing notes.   HISTORY  Chief Complaint Back Pain    HPI William Duke is a 51 y.o. male complains of low back pain, states he shoveled snow 2 days ago states pain radiates to both legs, he is having difficulty moving, he arrived by EMS today, he denies any fever, chills, chest pain shortness of breath or difficulty with bowels or bladder  Past Medical History:  Diagnosis Date  . Back pain   . Hypertension     There are no active problems to display for this patient.   History reviewed. No pertinent surgical history.  Prior to Admission medications   Medication Sig Start Date End Date Taking? Authorizing Provider  cyclobenzaprine (FLEXERIL) 10 MG tablet Take 1 tablet (10 mg total) by mouth 3 (three) times daily as needed for muscle spasms. 02/16/17   Eyleen Rawlinson, Roselyn BeringSusan W, PA-C  meloxicam (MOBIC) 15 MG tablet Take 1 tablet (15 mg total) by mouth daily. 02/16/17 02/16/18  Faythe GheeFisher, Daveda Larock W, PA-C    Allergies Patient has no known allergies.  No family history on file.  Social History Social History   Tobacco Use  . Smoking status: Current Every Day Smoker  . Smokeless tobacco: Never Used  Substance Use Topics  . Alcohol use: No    Frequency: Never  . Drug use: No    Review of Systems Constitutional: No fever/chills Eyes: No visual changes. ENT: No sore throat. Respiratory: Denies cough Genitourinary: Negative for dysuria. Musculoskeletal: Positive for back pain. Skin: Negative for rash.    ____________________________________________   PHYSICAL EXAM:  VITAL SIGNS: ED Triage Vitals  Enc Vitals Group     BP 02/16/17 1617 (!) 152/83     Pulse Rate 02/16/17 1617 98     Resp 02/16/17 1617 16     Temp 02/16/17  1617 98.7 F (37.1 C)     Temp Source 02/16/17 1617 Oral     SpO2 02/16/17 1617 96 %     Weight 02/16/17 1617 260 lb (117.9 kg)     Height 02/16/17 1617 6\' 3"  (1.905 m)     Head Circumference --      Peak Flow --      Pain Score 02/16/17 1633 9     Pain Loc --      Pain Edu? --      Excl. in GC? --     Constitutional: Alert and oriented. Well appearing and in no acute distress. Eyes: Conjunctivae are normal.  Head: Atraumatic. Nose: No congestion/rhinnorhea. Mouth/Throat: Mucous membranes are moist.   Cardiovascular: Normal rate, regular rhythm.  Heart sounds are normal Respiratory: Normal respiratory effort.  No retractions, lungs are clear to auscultation GU: deferred Musculoskeletal: FROM all extremities, warm and well perfused.  Patient moves slowly, the lumbar area is tender to palpation, positive for spasms in the lumbar area, negative straight leg raise, patient is able to stand on his own, he is able to lift both great toes, neurovascular is intact Neurologic:  Normal speech and language.  Skin:  Skin is warm, dry and intact. No rash noted. Psychiatric: Mood and affect are normal. Speech and behavior are normal.  ____________________________________________   LABS (all labs ordered are listed, but only abnormal results are displayed)  Labs Reviewed - No data to display  ____________________________________________   ____________________________________________  RADIOLOGY    ____________________________________________   PROCEDURES  Procedure(s) performed: No      ____________________________________________   INITIAL IMPRESSION / ASSESSMENT AND PLAN / ED COURSE  Pertinent labs & imaging results that were available during my care of the patient were reviewed by me and considered in my medical decision making (see chart for details).  Patient is a 51 year old male that complains of low back pain after shoveling snow, his pain was relieved with Toradol  30 mg IM and Flexeril 10 mg p.o., he is being discharged with a prescription for meloxicam 15 mg daily and Flexeril 10 mg 3 times a day for muscle strain and spasm, he is to apply ice to the area, he is to perform stretches, if he is worsening he should return to the emergency department or see his regular doctor, if he is not better in 5-7 days he is to follow-up with orthopedics, orthopedic doctors phone number was given to him, he agrees with the treatment plan and states he will comply, patient was discharged in stable condition      ____________________________________________   FINAL CLINICAL IMPRESSION(S) / ED DIAGNOSES  Final diagnoses:  Strain of lumbar region, initial encounter      NEW MEDICATIONS STARTED DURING THIS VISIT:  This SmartLink is deprecated. Use AVSMEDLIST instead to display the medication list for a patient.   Note:  This document was prepared using Dragon voice recognition software and may include unintentional dictation errors.    Faythe GheeFisher, Leinaala Catanese W, PA-C 02/16/17 1757    Schaevitz, Myra Rudeavid Matthew, MD 02/16/17 [phone]

## 2017-02-16 NOTE — Discharge Instructions (Signed)
Follow-up with your regular doctor if you are not better in 5-7 days, apply ice to your lower back, try to stretch her muscles as much as you can, you have been given meloxicam 15 mg daily for pain and inflammation, Flexeril 10 mg 3 times a day as needed for muscle strain and pain, the Flexeril may make you drowsy so do not operate heavy machinery while using this medication, if you are worsening please return to the emergency department

## 2017-03-15 ENCOUNTER — Emergency Department
Admission: EM | Admit: 2017-03-15 | Discharge: 2017-03-15 | Disposition: A | Discharge status: Home / Self Care | Payer: Self-pay | Attending: Emergency Medicine | Admitting: Emergency Medicine

## 2017-03-15 ENCOUNTER — Encounter: Payer: Self-pay | Admitting: *Deleted

## 2017-03-15 ENCOUNTER — Other Ambulatory Visit: Payer: Self-pay

## 2017-03-15 DIAGNOSIS — J019 Acute sinusitis, unspecified: Secondary | ICD-10-CM | POA: Insufficient documentation

## 2017-03-15 DIAGNOSIS — Z79899 Other long term (current) drug therapy: Secondary | ICD-10-CM | POA: Insufficient documentation

## 2017-03-15 DIAGNOSIS — F17210 Nicotine dependence, cigarettes, uncomplicated: Secondary | ICD-10-CM | POA: Insufficient documentation

## 2017-03-15 DIAGNOSIS — J209 Acute bronchitis, unspecified: Secondary | ICD-10-CM | POA: Insufficient documentation

## 2017-03-15 DIAGNOSIS — J40 Bronchitis, not specified as acute or chronic: Secondary | ICD-10-CM

## 2017-03-15 MED ORDER — AZITHROMYCIN 500 MG PO TABS: 500.0000 mg | ORAL_TABLET | Freq: Every day | ORAL | 0 refills | Status: AC

## 2017-03-15 MED ORDER — BENZONATATE 100 MG PO CAPS: 100.0000 mg | ORAL_CAPSULE | Freq: Three times a day (TID) | ORAL | 0 refills | Status: AC | PRN

## 2017-03-15 NOTE — ED Triage Notes (Signed)
Pt reports sinus congestion, runny nose, cough.  Sx for 2 days.  Pt alert

## 2017-03-15 NOTE — ED Provider Notes (Signed)
[facility] Emergency Department Provider Note    First MD Initiated Contact with Patient 03/15/17 0407     (approximate)  I have reviewed the triage vital signs and the nursing notes.   HISTORY  Chief Complaint Sinus Problem    HPI William Duke is a 52 y.o. male to the emergency department with a 2-day history of nasal congestion rhinorrhea cough.  Patient does admit to half a pack per day cigarette use.  Patient denies any fever afebrile on presentation.  Patient asleep on my arrival to the treatment room.   Past Medical History:  Diagnosis Date  . Back pain   . Hypertension     There are no active problems to display for this patient.   No past surgical history on file.  Prior to Admission medications   Medication Sig Start Date End Date Taking? Authorizing Provider  cyclobenzaprine (FLEXERIL) 10 MG tablet Take 1 tablet (10 mg total) by mouth 3 (three) times daily as needed for muscle spasms. 02/16/17   Fisher, Roselyn BeringSusan W, PA-C  meloxicam (MOBIC) 15 MG tablet Take 1 tablet (15 mg total) by mouth daily. 02/16/17 02/16/18  Faythe GheeFisher, Susan W, PA-C    Allergies Patient has no known allergies.  No family history on file.  Social History Social History   Tobacco Use  . Smoking status: Current Every Day Smoker  . Smokeless tobacco: Never Used  Substance Use Topics  . Alcohol use: No    Frequency: Never  . Drug use: No    Review of Systems Constitutional: No fever/chills Eyes: No visual changes. ENT: No sore throat.  Positive for nasal congestion Cardiovascular: Denies chest pain. Respiratory: Denies shortness of breath.  Positive for cough  gastrointestinal: No abdominal pain.  No nausea, no vomiting.  No diarrhea.  No constipation. Genitourinary: Negative for dysuria. Musculoskeletal: Negative for neck pain.  Negative for back pain. Integumentary: Negative for rash. Neurological: Negative for headaches, focal weakness or  numbness.   ____________________________________________   PHYSICAL EXAM:  VITAL SIGNS: ED Triage Vitals  Enc Vitals Group     BP 03/15/17 0203 (!) 189/92     Pulse Rate 03/15/17 0203 66     Resp 03/15/17 0203 18     Temp 03/15/17 0203 98.6 F (37 C)     Temp Source 03/15/17 0203 Oral     SpO2 03/15/17 0203 95 %     Weight 03/15/17 0204 115.7 kg (255 lb)     Height 03/15/17 0204 1.905 m (6\' 3" )     Head Circumference --      Peak Flow --      Pain Score --      Pain Loc --      Pain Edu? --      Excl. in GC? --     Constitutional: Alert and oriented. Well appearing and in no acute distress. Eyes: Conjunctivae are normal.  Head: Atraumatic. Mouth/Throat: Mucous membranes are moist. Oropharynx non-erythematous. Neck: No stridor.   Cardiovascular: Normal rate, regular rhythm. Good peripheral circulation. Grossly normal heart sounds. Respiratory: Normal respiratory effort.  No retractions. Lungs CTAB. Gastrointestinal: Soft and nontender. No distention.  Musculoskeletal: No lower extremity tenderness nor edema. No gross deformities of extremities. Neurologic:  Normal speech and language. No gross focal neurologic deficits are appreciated.  Skin:  Skin is warm, dry and intact. No rash noted. Psychiatric: Mood and affect are normal. Speech and behavior are normal.    Procedures   ____________________________________________  INITIAL IMPRESSION / ASSESSMENT AND PLAN / ED COURSE  As part of my medical decision making, I reviewed the following data within the electronic MEDICAL RECORD [id number] year old male present with above-stated history and physical exam of nasal congestion rhinorrhea and cough.  Concern for possible sinusitis and bronchitis.  Consider the possibility of pneumonia however patient afebrile adventitious sounds noted on auscultation.  Patient prescribed azithromycin.  Patient also given Tessalon Perles FINAL CLINICAL IMPRESSION(S) / ED DIAGNOSES  Final  diagnoses:  Bronchitis  Acute non-recurrent sinusitis, unspecified location     MEDICATIONS GIVEN DURING THIS VISIT:  Medications - No data to display   ED Discharge Orders    None       Note:  This document was prepared using Dragon voice recognition software and may include unintentional dictation errors.    Darci Current, MD 03/15/17 [phone]

## 2018-07-25 ENCOUNTER — Encounter: Payer: Self-pay | Admitting: Emergency Medicine

## 2018-07-25 ENCOUNTER — Emergency Department
Admission: EM | Admit: 2018-07-25 | Discharge: 2018-07-25 | Disposition: A | Discharge status: Home / Self Care | Payer: Self-pay | Attending: Emergency Medicine | Admitting: Emergency Medicine

## 2018-07-25 ENCOUNTER — Other Ambulatory Visit: Payer: Self-pay

## 2018-07-25 ENCOUNTER — Emergency Department: Payer: Self-pay

## 2018-07-25 DIAGNOSIS — F17200 Nicotine dependence, unspecified, uncomplicated: Secondary | ICD-10-CM | POA: Insufficient documentation

## 2018-07-25 DIAGNOSIS — S39012A Strain of muscle, fascia and tendon of lower back, initial encounter: Secondary | ICD-10-CM | POA: Insufficient documentation

## 2018-07-25 DIAGNOSIS — Y9389 Activity, other specified: Secondary | ICD-10-CM | POA: Insufficient documentation

## 2018-07-25 DIAGNOSIS — X509XXA Other and unspecified overexertion or strenuous movements or postures, initial encounter: Secondary | ICD-10-CM | POA: Insufficient documentation

## 2018-07-25 DIAGNOSIS — I10 Essential (primary) hypertension: Secondary | ICD-10-CM | POA: Insufficient documentation

## 2018-07-25 DIAGNOSIS — Y990 Civilian activity done for income or pay: Secondary | ICD-10-CM | POA: Insufficient documentation

## 2018-07-25 DIAGNOSIS — Y9289 Other specified places as the place of occurrence of the external cause: Secondary | ICD-10-CM | POA: Insufficient documentation

## 2018-07-25 MED ORDER — PREDNISONE 20 MG PO TABS: 20.0000 mg | ORAL_TABLET | Freq: Two times a day (BID) | ORAL | 0 refills | Status: AC

## 2018-07-25 MED ORDER — CYCLOBENZAPRINE HCL 10 MG PO TABS: 10.0000 mg | ORAL_TABLET | Freq: Three times a day (TID) | ORAL | 0 refills | Status: AC | PRN

## 2018-07-25 NOTE — ED Provider Notes (Signed)
[facility] Emergency Department Provider Note ____________________________________________  Time seen: 1015  I have reviewed the triage vital signs and the nursing notes.  HISTORY  Chief Complaint  Back Pain  HPI William Duke is a 53 y.o. male presents to the ED with a 7581-month complaint of intermittent low back pain.  Patient just denies any recent injury, accident, or trauma.  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  He describes lifting large logs and scraps of wood, he denies seeking care at the onset or anytime in the interim.  He presents now for evaluation of his back pain.  He denies any fevers, chills, sweats he also denies any chest pain, shortness of breath.  He has had no distal paresthesias, foot drop, bladder or bowel incontinence, or saddle anesthesia.  Past Medical History:  Diagnosis Date  . Back pain   . Hypertension     There are no active problems to display for this patient.   History reviewed. No pertinent surgical history.  Prior to Admission medications   Medication Sig Start Date End Date Taking? Authorizing Provider  cyclobenzaprine (FLEXERIL) 10 MG tablet Take 1 tablet (10 mg total) by mouth 3 (three) times daily as needed for up to 10 days. 07/25/18 08/04/18  Dhruv Christina, Charlesetta IvoryJenise V Bacon, PA-C  predniSONE (DELTASONE) 20 MG tablet Take 1 tablet (20 mg total) by mouth 2 (two) times daily with a meal for 5 days. 07/25/18 07/30/18  Sabriah Hobbins, Charlesetta IvoryJenise V Bacon, PA-C    Allergies Patient has no known allergies.  No family history on file.  Social History Social History   Tobacco Use  . Smoking status: Current Every Day Smoker  . Smokeless tobacco: Never Used  Substance Use Topics  . Alcohol use: No    Frequency: Never  . Drug use: No    Review of Systems  Constitutional: Negative for fever. Eyes: Negative for visual changes. ENT: Negative for sore throat. Cardiovascular:  Negative for chest pain. Respiratory: Negative for shortness of breath. Gastrointestinal: Negative for abdominal pain, vomiting and diarrhea. Genitourinary: Negative for dysuria. Musculoskeletal: Positive for back pain. Skin: Negative for rash. Neurological: Negative for headaches, focal weakness or numbness. ____________________________________________  PHYSICAL EXAM:  VITAL SIGNS: ED Triage Vitals  Enc Vitals Group     BP 07/25/18 0945 (!) 182/106     Pulse Rate 07/25/18 0945 75     Resp 07/25/18 0945 16     Temp 07/25/18 0945 97.7 F (36.5 C)     Temp Source 07/25/18 0945 Oral     SpO2 07/25/18 0945 92 %     Weight 07/25/18 0946 240 lb (108.9 kg)     Height 07/25/18 0946 6\' 2"  (1.88 m)     Head Circumference --      Peak Flow --      Pain Score 07/25/18 0950 8     Pain Loc --      Pain Edu? --      Excl. in GC? --     Constitutional: Alert and oriented. Well appearing and in no distress. Head: Normocephalic and atraumatic. Eyes: Conjunctivae are normal. Normal extraocular movements Cardiovascular: Normal rate, regular rhythm. Normal distal pulses. Respiratory: Normal respiratory effort. No wheezes/rales/rhonchi. Gastrointestinal: Soft and nontender. No distention. Musculoskeletal: Normal spinal alignment without midline tenderness, spasm, deformity, or step-off.  Patient transitions from sit to stand without assistance.  Normal flexion extension range on exam.  Nontender with  normal range of motion in all extremities.  Neurologic: Nerves II through XII grossly intact.  Normal LE DTRs bilaterally.  Negative seated straight leg raise.  Normal gait without ataxia. Normal speech and language. No gross focal neurologic deficits are appreciated. Skin:  Skin is warm, dry and intact. No rash noted. Psychiatric: Mood and affect are normal. Patient exhibits appropriate insight and judgment. ____________________________________________   RADIOLOGY  Lumbar Spine No acute  findings. Mild degenerative changes.  ____________________________________________  PROCEDURES  Procedures ____________________________________________  INITIAL IMPRESSION / ASSESSMENT AND PLAN / ED COURSE  William Duke was evaluated in Emergency Department on 07/25/2018 for the symptoms described in the history of present illness. He was evaluated in the context of the global COVID-19 pandemic, which necessitated consideration that the patient might be at risk for infection with the SARS-CoV-2 virus that causes COVID-19. Institutional protocols and algorithms that pertain to the evaluation of patients at risk for COVID-19 are in a state of rapid change based on information released by regulatory bodies including the CDC and federal and state organizations. These policies and algorithms were followed during the patient's care in the ED.  Patient with ED evaluation of a 2 to 78-month complaint of low back pain.  Patient is with a benign exam on presentation.  His x-ray is negative for any acute findings at this time.  Patient is discharged with prescriptions for cyclobenzaprine and prednisone.  The patient left with his discharge instructions provided by me, but a secure was not secured on the signature pad. ____________________________________________  FINAL CLINICAL IMPRESSION(S) / ED DIAGNOSES  Final diagnoses:  Strain of lumbar region, initial encounter      Lissa Hoard, PA-C 07/25/18 1737    Jeanmarie Plant, MD 07/27/18 0000

## 2018-07-25 NOTE — ED Notes (Signed)
Pt ambulatory to treatment room. Pt c/o back pain over the last few months. States nothing makes it better or worse, states pain is constant

## 2018-07-25 NOTE — Discharge Instructions (Signed)
Take the prescription meds as directed. Follow-up with [facility] as needed.  

## 2018-07-25 NOTE — ED Triage Notes (Signed)
Pt here with c/o back pain from carrying "heavy stuff" at work a few months ago, wants to claim workers comp because his back pain is getting worse. 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

## 2018-07-25 NOTE — ED Notes (Signed)
Patient left prior to giving signature for discharge.  Patient received discharge instructions and discharge paperwork.

## 2020-07-23 ENCOUNTER — Emergency Department
Admission: EM | Admit: 2020-07-23 | Discharge: 2020-07-23 | Disposition: A | Discharge status: Home / Self Care | Payer: Self-pay | Attending: Emergency Medicine | Admitting: Emergency Medicine

## 2020-07-23 ENCOUNTER — Other Ambulatory Visit: Payer: Self-pay

## 2020-07-23 ENCOUNTER — Encounter: Payer: Self-pay | Admitting: Emergency Medicine

## 2020-07-23 DIAGNOSIS — T148XXA Other injury of unspecified body region, initial encounter: Secondary | ICD-10-CM

## 2020-07-23 DIAGNOSIS — F17200 Nicotine dependence, unspecified, uncomplicated: Secondary | ICD-10-CM | POA: Insufficient documentation

## 2020-07-23 DIAGNOSIS — S91302A Unspecified open wound, left foot, initial encounter: Secondary | ICD-10-CM | POA: Insufficient documentation

## 2020-07-23 DIAGNOSIS — I10 Essential (primary) hypertension: Secondary | ICD-10-CM | POA: Insufficient documentation

## 2020-07-23 DIAGNOSIS — Z23 Encounter for immunization: Secondary | ICD-10-CM | POA: Insufficient documentation

## 2020-07-23 DIAGNOSIS — W450XXA Nail entering through skin, initial encounter: Secondary | ICD-10-CM | POA: Insufficient documentation

## 2020-07-23 MED ORDER — TETANUS-DIPHTH-ACELL PERTUSSIS 5-2.5-18.5 LF-MCG/0.5 IM SUSY
0.5000 mL | PREFILLED_SYRINGE | Freq: Once | INTRAMUSCULAR | Status: AC
  Administered 2020-07-23: 0.5 mL via INTRAMUSCULAR
  Filled 2020-07-23: qty 0.5

## 2020-07-23 NOTE — ED Notes (Signed)
See triage note  Presents with some foot discomfort  States he stepped on a nail yesterday  Unsure of tetanus status

## 2020-07-23 NOTE — ED Triage Notes (Signed)
C/O stepping on a nail yesterday -- patient removed nail from foot.  Presents for tetanus shot.

## 2020-07-23 NOTE — ED Provider Notes (Signed)
[facility] Emergency Department Provider Note  ____________________________________________  Time seen: Approximately 12:58 PM  I have reviewed the triage vital signs and the nursing notes.   HISTORY  Chief Complaint Foot Injury    HPI William Duke is a 55 y.o. male with a past history of hypertension who comes ED reporting a puncture wound to the left foot yesterday from stepping on a nail, wonders if he needs a tetanus shot.  Last tetanus was many years ago.  Reports it was a clean nail, small weight, embedded about half an inch from the plantar surface, able to be removed easily by him.  He washed the area.  Denies any pain, no fever.  No paresthesias or motor weakness in the foot.  He has normal gait and no pain with weightbearing and walking.     Past Medical History:  Diagnosis Date  . Back pain   . Hypertension      There are no problems to display for this patient.    History reviewed. No pertinent surgical history.   Prior to Admission medications   Not on File     Allergies Patient has no known allergies.   No family history on file.  Social History Social History   Tobacco Use  . Smoking status: Current Every Day Smoker  . Smokeless tobacco: Never Used  Substance Use Topics  . Alcohol use: No  . Drug use: No    Review of Systems  Constitutional:   No fever or chills.  ENT:   No sore throat. No rhinorrhea. Cardiovascular:   No chest pain or syncope. Respiratory:   No dyspnea or cough. Gastrointestinal:   Negative for abdominal pain, vomiting and diarrhea.  Musculoskeletal:   Negative for focal pain or swelling All other systems reviewed and are negative except as documented above in ROS and HPI.  ____________________________________________   PHYSICAL EXAM:  VITAL SIGNS: ED Triage Vitals  Enc Vitals Group     BP 07/23/20 1228 (!) 179/107     Pulse Rate 07/23/20 1228 81     Resp 07/23/20 1228 16      Temp 07/23/20 1228 97.7 F (36.5 C)     Temp Source 07/23/20 1228 Oral     SpO2 07/23/20 1228 92 %     Weight 07/23/20 1218 240 lb 1.3 oz (108.9 kg)     Height 07/23/20 1218 6\' 2"  (1.88 m)     Head Circumference --      Peak Flow --      Pain Score 07/23/20 1217 0     Pain Loc --      Pain Edu? --      Excl. in GC? --     Vital signs reviewed, nursing assessments reviewed.   Constitutional:   Alert and oriented. Non-toxic appearance. Eyes:   Conjunctivae are normal. EOMI. ENT      Head:   Normocephalic and atraumatic.           Neck:   No meningismus. Full ROM.  Cardiovascular:   RRR.. Cap refill less than 2 seconds. Respiratory:   Normal respiratory effort without tachypnea/retractions. Musculoskeletal:   Normal range of motion in all extremities.  No edema.  No foot swelling or tenderness.  No bony tenderness.  No inflammatory changes Neurologic:   Normal speech and language.  Motor grossly intact. No acute focal neurologic deficits are appreciated.  Skin:    Skin is warm, dry and intact. No rash noted.  No wounds.  No identifiable puncture wound on the left foot ____________________________________________    LABS (pertinent positives/negatives) (all labs ordered are listed, but only abnormal results are displayed) Labs Reviewed - No data to display ____________________________________________   EKG  ____________________________________________    RADIOLOGY  No results found.  ____________________________________________   PROCEDURES Procedures  ____________________________________________  CLINICAL IMPRESSION / ASSESSMENT AND PLAN / ED COURSE  Pertinent labs & imaging results that were available during my care of the patient were reviewed by me and considered in my medical decision making (see chart for details).  William Duke was evaluated in Emergency Department on 07/23/2020 for the symptoms described in the history of present illness. He was  evaluated in the context of the global COVID-19 pandemic, which necessitated consideration that the patient might be at risk for infection with the SARS-CoV-2 virus that causes COVID-19. Institutional protocols and algorithms that pertain to the evaluation of patients at risk for COVID-19 are in a state of rapid change based on information released by regulatory bodies including the CDC and federal and state organizations. These policies and algorithms were followed during the patient's care in the ED.   Patient presents after a nail puncture wound in the left plantar foot yesterday.  Today there is no sign of a wound, no sign of inflammation.  No pain.  Nothing to irrigate or suture.  Low suspicion for retained foreign body.  Will update tetanus and discharge home.      ____________________________________________   FINAL CLINICAL IMPRESSION(S) / ED DIAGNOSES    Final diagnoses:  Puncture wound     ED Discharge Orders    None      Portions of this note were generated with dragon dictation software. Dictation errors may occur despite best attempts at proofreading.   Sharman Cheek, MD 07/23/20 1300

## 2020-07-23 NOTE — Discharge Instructions (Signed)
We gave you a tetanus shot today due to the nail puncture wound in your foot. Please return for reassessment if you develop worsening pain, redness, or swelling in the foot.

## 2022-10-05 ENCOUNTER — Emergency Department: Payer: Self-pay

## 2022-10-05 ENCOUNTER — Other Ambulatory Visit: Payer: Self-pay

## 2022-10-05 ENCOUNTER — Emergency Department
Admission: EM | Admit: 2022-10-05 | Discharge: 2022-10-05 | Disposition: A | Discharge status: Home / Self Care | Payer: Self-pay | Attending: Emergency Medicine | Admitting: Emergency Medicine

## 2022-10-05 DIAGNOSIS — R0789 Other chest pain: Secondary | ICD-10-CM | POA: Insufficient documentation

## 2022-10-05 DIAGNOSIS — I10 Essential (primary) hypertension: Secondary | ICD-10-CM | POA: Insufficient documentation

## 2022-10-05 DIAGNOSIS — E119 Type 2 diabetes mellitus without complications: Secondary | ICD-10-CM | POA: Insufficient documentation

## 2022-10-05 LAB — BASIC METABOLIC PANEL
Anion gap: 9 (ref 5–15)
BUN: 15 mg/dL (ref 6–20)
CO2: 24 mmol/L (ref 22–32)
Calcium: 8.9 mg/dL (ref 8.9–10.3)
Chloride: 105 mmol/L (ref 98–111)
Creatinine, Ser: 0.74 mg/dL (ref 0.61–1.24)
GFR, Estimated: >60 mL/min (ref >60)
Glucose, Bld: 195 mg/dL — ABNORMAL HIGH (ref 70–99)
Potassium: 3.8 mmol/L (ref 3.5–5.1)
Sodium: 138 mmol/L (ref 135–145)

## 2022-10-05 LAB — CBC
HCT: 42.1 % (ref 39.0–52.0)
Hemoglobin: 14 g/dL (ref 13.0–17.0)
MCH: 28.9 pg (ref 26.0–34.0)
MCHC: 33.3 g/dL (ref 30.0–36.0)
MCV: 87 fL (ref 80.0–100.0)
Platelets: 282 10*3/uL (ref 150–400)
RBC: 4.84 MIL/uL (ref 4.22–5.81)
RDW: 15.3 % (ref 11.5–15.5)
WBC: 9 10*3/uL (ref 4.0–10.5)
nRBC: 0 % (ref 0.0–0.2)

## 2022-10-05 LAB — TROPONIN I (HIGH SENSITIVITY): Troponin I (High Sensitivity): 5 ng/L (ref <18)

## 2022-10-05 MED ORDER — NAPROXEN 500 MG PO TABS: 500.0000 mg | ORAL_TABLET | Freq: Two times a day (BID) | ORAL | 0 refills | Status: AC

## 2022-10-05 NOTE — ED Triage Notes (Signed)
Pt to ED for chest pain for 10 days, reports pain to left arm and back. Denies n/v/shob. NAD noted

## 2022-10-05 NOTE — ED Provider Notes (Signed)
[facility] Provider Note    Event Date/Time   First MD Initiated Contact with Patient 10/05/22 1118     (approximate)   History   Chief Complaint: Chest Pain   HPI  William Duke is a 57 y.o. male with a past history of hypertension and diabetes who comes ED complaining of intermittent chest pain for the past 2 weeks, started while he was incarcerated, and has continued after being released about 12 days ago.  Feels it in the left chest.  Feels like an ache.  Radiates to left arm.  Not exertional, not pleuritic.  No shortness of breath diaphoresis or vomiting.  Feels worse with movement of the left arm.     Physical Exam   Triage Vital Signs: ED Triage Vitals  Encounter Vitals Group     BP 10/05/22 1050 (!) 139/104     Systolic BP Percentile --      Diastolic BP Percentile --      Pulse Rate 10/05/22 1050 75     Resp 10/05/22 1050 18     Temp 10/05/22 1050 98.2 F (36.8 C)     Temp src --      SpO2 10/05/22 1050 96 %     Weight 10/05/22 1048 240 lb (108.9 kg)     Height 10/05/22 1048 6\' 3"  (1.905 m)     Head Circumference --      Peak Flow --      Pain Score 10/05/22 1048 4     Pain Loc --      Pain Education --      Exclude from Growth Chart --     Most recent vital signs: Vitals:   10/05/22 1050  BP: (!) 139/104  Pulse: 75  Resp: 18  Temp: 98.2 F (36.8 C)  SpO2: 96%    General: Awake, no distress.  CV:  Good peripheral perfusion.  Regular rate and rhythm, normal distal pulses Resp:  Normal effort.  Clear to auscultation bilaterally Abd:  No distention.  Soft nontender Other:  Chest wall nontender.  Full range of motion in all extremities.  No focal tenderness of the left shoulder joint   ED Results / Procedures / Treatments   Labs (all labs ordered are listed, but only abnormal results are displayed) Labs Reviewed  BASIC METABOLIC PANEL - Abnormal; Notable for the following components:      Result Value   Glucose,  Bld 195 (*)    All other components within normal limits  CBC  TROPONIN I (HIGH SENSITIVITY)  TROPONIN I (HIGH SENSITIVITY)     EKG Interpreted by me Sinus rhythm rate of 78.  Right axis, normal intervals poor R wave progression.  Normal ST segments and T waves.  No ischemic changes.   RADIOLOGY Chest x-ray interpreted by me, appears normal.  Radiology report reviewed.  X-ray left shoulder unremarkable   PROCEDURES:  Procedures   MEDICATIONS ORDERED IN ED: Medications - No data to display   IMPRESSION / MDM / ASSESSMENT AND PLAN / ED COURSE  I reviewed the triage vital signs and the nursing notes.  DDx: Musculoskeletal pain, non-STEMI, subclavian stenosis, electrolyte abnormality  Patient's presentation is most consistent with acute presentation with potential threat to life or bodily function.  Patient presents with intermittent atypical chest pain for the past 2 weeks.  Heart score low risk.  Troponin normal, vitals and exam reassuring.  Stable for discharge and outpatient cardiology referral.  FINAL CLINICAL IMPRESSION(S) / ED DIAGNOSES   Final diagnoses:  Atypical chest pain     Rx / DC Orders   ED Discharge Orders          Ordered    Ambulatory referral to Cardiology       Comments: If you have not heard from the Cardiology office within the next 72 hours please call [phone].   10/05/22 1330    naproxen (NAPROSYN) 500 MG tablet  2 times daily with meals        10/05/22 1330             Note:  This document was prepared using Dragon voice recognition software and may include unintentional dictation errors.   Sharman Cheek, MD 10/05/22 [phone]

## 2022-11-25 ENCOUNTER — Telehealth: Payer: Self-pay

## 2022-11-25 NOTE — Telephone Encounter (Signed)
The patient is scheduled to see Debbe Odea, MD on 12-05-22 as a new patient. Called the telephone number on file, but the patient's brother answered and gave me another telephone number to contact the [phone]). Called and left a voice message to inquire about any previous cardiac history. Requested the patient to call back at their earliest convenience.

## 2022-12-05 ENCOUNTER — Encounter: Payer: Self-pay | Admitting: Cardiology

## 2022-12-05 ENCOUNTER — Ambulatory Visit: Payer: 59 | Attending: Cardiology | Admitting: Cardiology
